# Patient Record
Sex: Male | Born: 1990 | Race: Black or African American | Hispanic: No | Marital: Single | Smoking: Never smoker
Health system: Southern US, Community
[De-identification: ages and names within clinical notes are randomized; demographics above are authoritative.]

---

## 2009-08-19 ENCOUNTER — Emergency Department (HOSPITAL_COMMUNITY): Admission: EM | Admit: 2009-08-19 | Discharge: 2009-08-20 | Payer: Self-pay | Admitting: Emergency Medicine

## 2012-05-11 ENCOUNTER — Emergency Department (HOSPITAL_COMMUNITY): Payer: Managed Care, Other (non HMO)

## 2012-05-11 ENCOUNTER — Encounter (HOSPITAL_COMMUNITY): Payer: Self-pay | Admitting: Emergency Medicine

## 2012-05-11 ENCOUNTER — Emergency Department (HOSPITAL_COMMUNITY)
Admission: EM | Admit: 2012-05-11 | Discharge: 2012-05-11 | Disposition: A | Discharge status: Home / Self Care | Payer: Managed Care, Other (non HMO) | Attending: Emergency Medicine | Admitting: Emergency Medicine

## 2012-05-11 DIAGNOSIS — K922 Gastrointestinal hemorrhage, unspecified: Secondary | ICD-10-CM

## 2012-05-11 LAB — CBC WITH DIFFERENTIAL/PLATELET
Eosinophils Relative: 0 % (ref 0–5)
HCT: 40.7 % (ref 39.0–52.0)
Lymphocytes Relative: 16 % (ref 12–46)
Lymphs Abs: 1.9 10*3/uL (ref 0.7–4.0)
MCV: 70.9 fL — ABNORMAL LOW (ref 78.0–100.0)
Monocytes Absolute: 0.6 10*3/uL (ref 0.1–1.0)
Monocytes Relative: 5 % (ref 3–12)
RBC: 5.74 MIL/uL (ref 4.22–5.81)
WBC: 11.6 10*3/uL — ABNORMAL HIGH (ref 4.0–10.5)

## 2012-05-11 LAB — URINALYSIS, ROUTINE W REFLEX MICROSCOPIC
Glucose, UA: NEGATIVE mg/dL
Hgb urine dipstick: NEGATIVE
Ketones, ur: 15 mg/dL — AB
Leukocytes, UA: NEGATIVE
pH: 8 (ref 5.0–8.0)

## 2012-05-11 LAB — GASTRIC OCCULT BLOOD (1-CARD TO LAB)

## 2012-05-11 LAB — COMPREHENSIVE METABOLIC PANEL
ALT: 36 U/L (ref 0–53)
AST: 30 U/L (ref 0–37)
Albumin: 4.6 g/dL (ref 3.5–5.2)
Alkaline Phosphatase: 97 U/L (ref 39–117)
CO2: 28 mEq/L (ref 19–32)
Creatinine, Ser: 1.13 mg/dL (ref 0.50–1.35)
Glucose, Bld: 112 mg/dL — ABNORMAL HIGH (ref 70–99)
Total Bilirubin: 0.2 mg/dL — ABNORMAL LOW (ref 0.3–1.2)
Total Protein: 7.7 g/dL (ref 6.0–8.3)

## 2012-05-11 LAB — LIPASE, BLOOD: Lipase: 24 U/L (ref 11–59)

## 2012-05-11 LAB — APTT: aPTT: 28 seconds (ref 24–37)

## 2012-05-11 LAB — PROTIME-INR: INR: 1.12 (ref 0.00–1.49)

## 2012-05-11 MED ORDER — LANSOPRAZOLE 30 MG PO CPDR: 30.0000 mg | DELAYED_RELEASE_CAPSULE | Freq: Every day | ORAL | Status: DC

## 2012-05-11 MED ORDER — LANSOPRAZOLE 30 MG PO CPDR: 30.0000 mg | DELAYED_RELEASE_CAPSULE | Freq: Every day | ORAL | Status: AC

## 2012-05-11 MED ORDER — PANTOPRAZOLE SODIUM 40 MG IV SOLR
40.0000 mg | Freq: Once | INTRAVENOUS | Status: AC
  Administered 2012-05-11: 40 mg via INTRAVENOUS
  Filled 2012-05-11: qty 40

## 2012-05-11 MED ORDER — ONDANSETRON HCL 4 MG PO TABS: 4.0000 mg | ORAL_TABLET | Freq: Four times a day (QID) | ORAL | Status: DC

## 2012-05-11 MED ORDER — SODIUM CHLORIDE 0.9 % IV BOLUS (SEPSIS)
1000.0000 mL | Freq: Once | INTRAVENOUS | Status: AC
  Administered 2012-05-11: 1000 mL via INTRAVENOUS

## 2012-05-11 MED ORDER — ONDANSETRON HCL 4 MG PO TABS: 4.0000 mg | ORAL_TABLET | Freq: Four times a day (QID) | ORAL | Status: AC
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
## 2012-05-11 NOTE — ED Provider Notes (Signed)
History     CSN: [id number]  Arrival date & time 05/11/12  0039   First MD Initiated Contact with Patient 05/11/12 [phone]      Chief Complaint  Patient presents with  . Hematemesis  . Diarrhea    (Consider location/radiation/quality/duration/timing/severity/associated sxs/prior treatment) HPI Pt had several episodes of Vomiting today and 1 of diarrhea after eating [employer] this afternoon. States that at the end of several of the episodes of vomiting he noticed some bright blood streaked in the vomit. No blood in diarrhea. No fever, chills, abdominal pain. Pt states he is back to his normal states of health.                              History reviewed. No pertinent past medical history.  History reviewed. No pertinent past surgical history.  History reviewed. No pertinent family history.  History  Substance Use Topics  . Smoking status: Never Smoker   . Smokeless tobacco: Not on file  . Alcohol Use: No      Review of Systems  Constitutional: Negative for fever, chills and fatigue.  Respiratory: Negative for shortness of breath.   Cardiovascular: Negative for chest pain, palpitations and leg swelling.  Gastrointestinal: Positive for nausea, vomiting and diarrhea. Negative for abdominal pain, constipation, blood in stool and rectal pain.  Musculoskeletal: Negative for back pain.  Skin: Negative for rash and wound.  Neurological: Positive for dizziness. Negative for weakness, light-headedness, numbness and headaches.    Allergies  Review of patient's allergies indicates no known allergies.  Home Medications   Current Outpatient Rx  Name Route Sig Dispense Refill  . LANSOPRAZOLE 30 MG PO CPDR Oral Take 1 capsule (30 mg total) by mouth daily. 30 capsule 0  . ONDANSETRON HCL 4 MG PO TABS Oral Take 1 tablet (4 mg total) by mouth every 6 (six) hours. 12 tablet 0    BP 129/75  Pulse 75  Temp 96.9 F (36.1 C) (Oral)  Resp 18  SpO2 100%  Physical Exam  Nursing note  and vitals reviewed. Constitutional: He is oriented to person, place, and time. He appears well-developed and well-nourished. No distress.  HENT:  Head: Normocephalic and atraumatic.  Mouth/Throat: Oropharynx is clear and moist.  Eyes: EOM are normal. Pupils are equal, round, and reactive to light.  Neck: Normal range of motion. Neck supple.  Cardiovascular: Normal rate and regular rhythm.   Pulmonary/Chest: Effort normal and breath sounds normal. No respiratory distress. He has no wheezes. He has no rales.  Abdominal: Soft. Bowel sounds are normal. He exhibits no distension. There is no tenderness. There is no rebound and no guarding.  Musculoskeletal: Normal range of motion. He exhibits no edema and no tenderness.  Neurological: He is alert and oriented to person, place, and time.  Skin: Skin is warm and dry. No rash noted. No erythema.  Psychiatric: He has a normal mood and affect. His behavior is normal.    ED Course  Procedures (including critical care time)  Labs Reviewed  CBC WITH DIFFERENTIAL - Abnormal; Notable for the following:    WBC 11.6 (*)     MCV 70.9 (*)     MCH 23.5 (*)     Neutrophils Relative 79 (*)     Neutro Abs 9.1 (*)     All other components within normal limits  COMPREHENSIVE METABOLIC PANEL - Abnormal; Notable for the following:    Glucose, Bld 112 (*)  Total Bilirubin 0.2 (*)     All other components within normal limits  URINALYSIS, ROUTINE W REFLEX MICROSCOPIC - Abnormal; Notable for the following:    APPearance HAZY (*)     Ketones, ur 15 (*)     All other components within normal limits  POCT GASTRIC OCCULT BLOOD - Abnormal; Notable for the following:    Occult Blood, Gastric POSITIVE (*)     All other components within normal limits  LIPASE, BLOOD  PROTIME-INR  APTT  PH, GASTRIC FLUID (GASTROCCULT CARD)   Dg Abd Acute W/chest  05/11/2012  *RADIOLOGY REPORT*  Clinical Data: 21 year old male with abdominal pain, vomiting and fever.  ACUTE  ABDOMEN SERIES (ABDOMEN 2 VIEW & CHEST 1 VIEW)  Comparison: None  Findings: The cardiomediastinal silhouette is unremarkable. The lungs are clear. There is no evidence of airspace disease, pleural effusion or pneumothorax.  The bowel gas pattern is unremarkable. No dilated bowel loops or pneumoperitoneum noted. No suspicious calcifications are identified. The bony structures are within normal limits.  IMPRESSION: No evidence of acute abnormality - unremarkable bowel gas pattern.   Original Report Authenticated By: Rosendo Gros, M.D.      1. Upper GI bleeding      Dark, watery voit at bedside. Will check gastroccult and labs.  MDM   Discussed with Dr Dulce Sellar. Advised f/u with clinic for upper GI bleed likely mallory weiss tear. Pt remains asymptomatic, no pain or further vomiting. VS stable.        Loren Racer, MD 05/11/12 [phone]

## 2012-05-11 NOTE — ED Notes (Signed)
NAD upon discharge home. Pt denies pain. Prescriptions reviewed.

## 2013-11-06 IMAGING — CR DG ABDOMEN ACUTE W/ 1V CHEST
4 series · 4 of 4 positions shown · non-contrast
Comparison: None

CLINICAL DATA: 21-year-old male with abdominal pain, vomiting and
fever.

ACUTE ABDOMEN SERIES (ABDOMEN 2 VIEW & CHEST 1 VIEW)

[w chest pa]
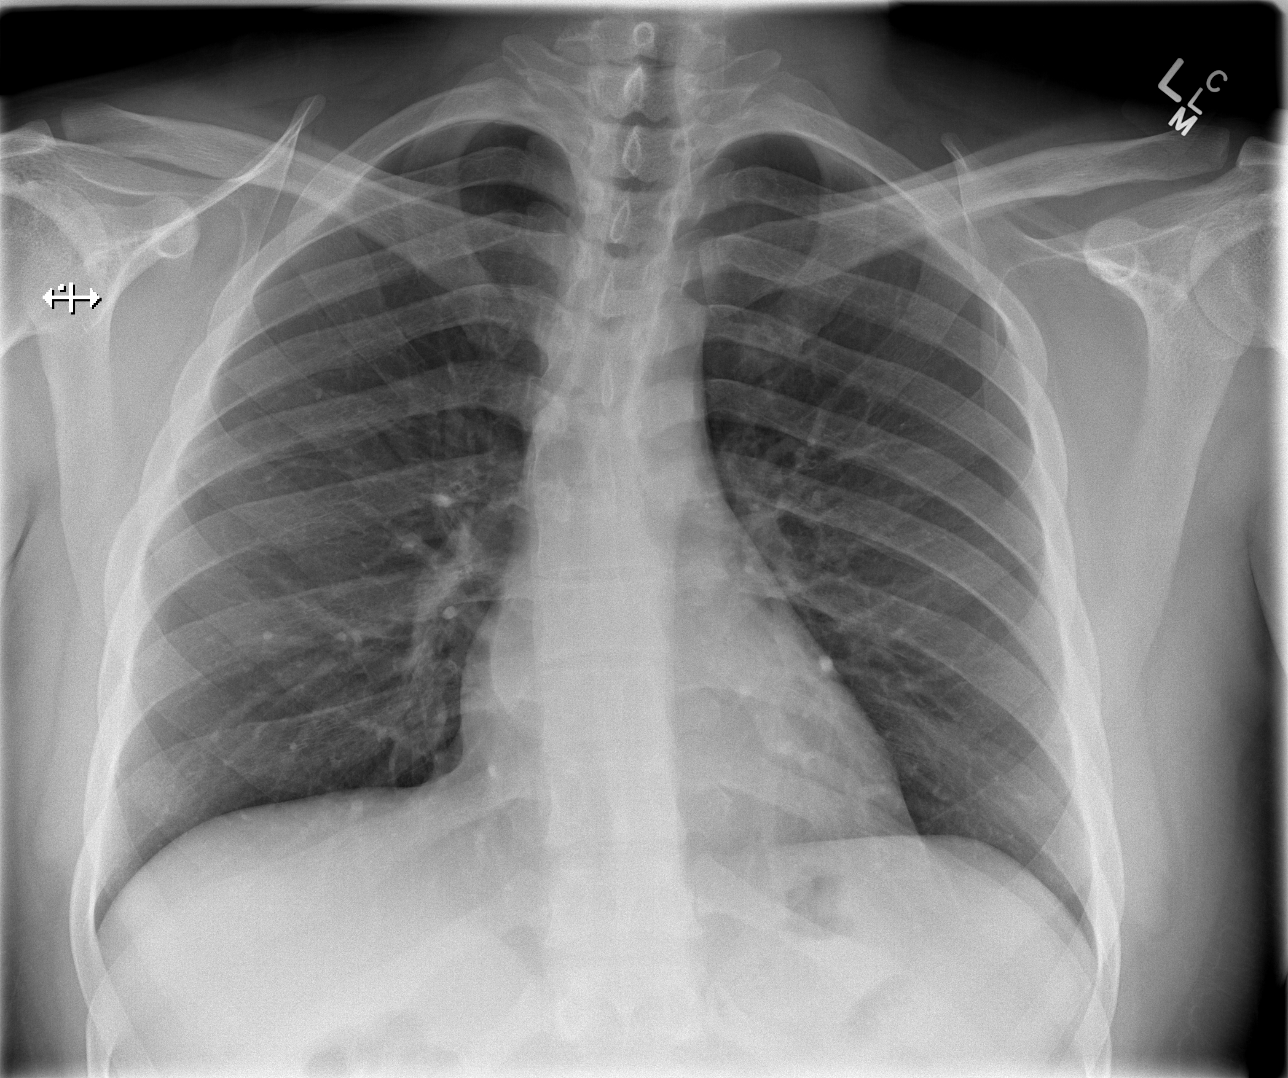

[w abdomen upright]
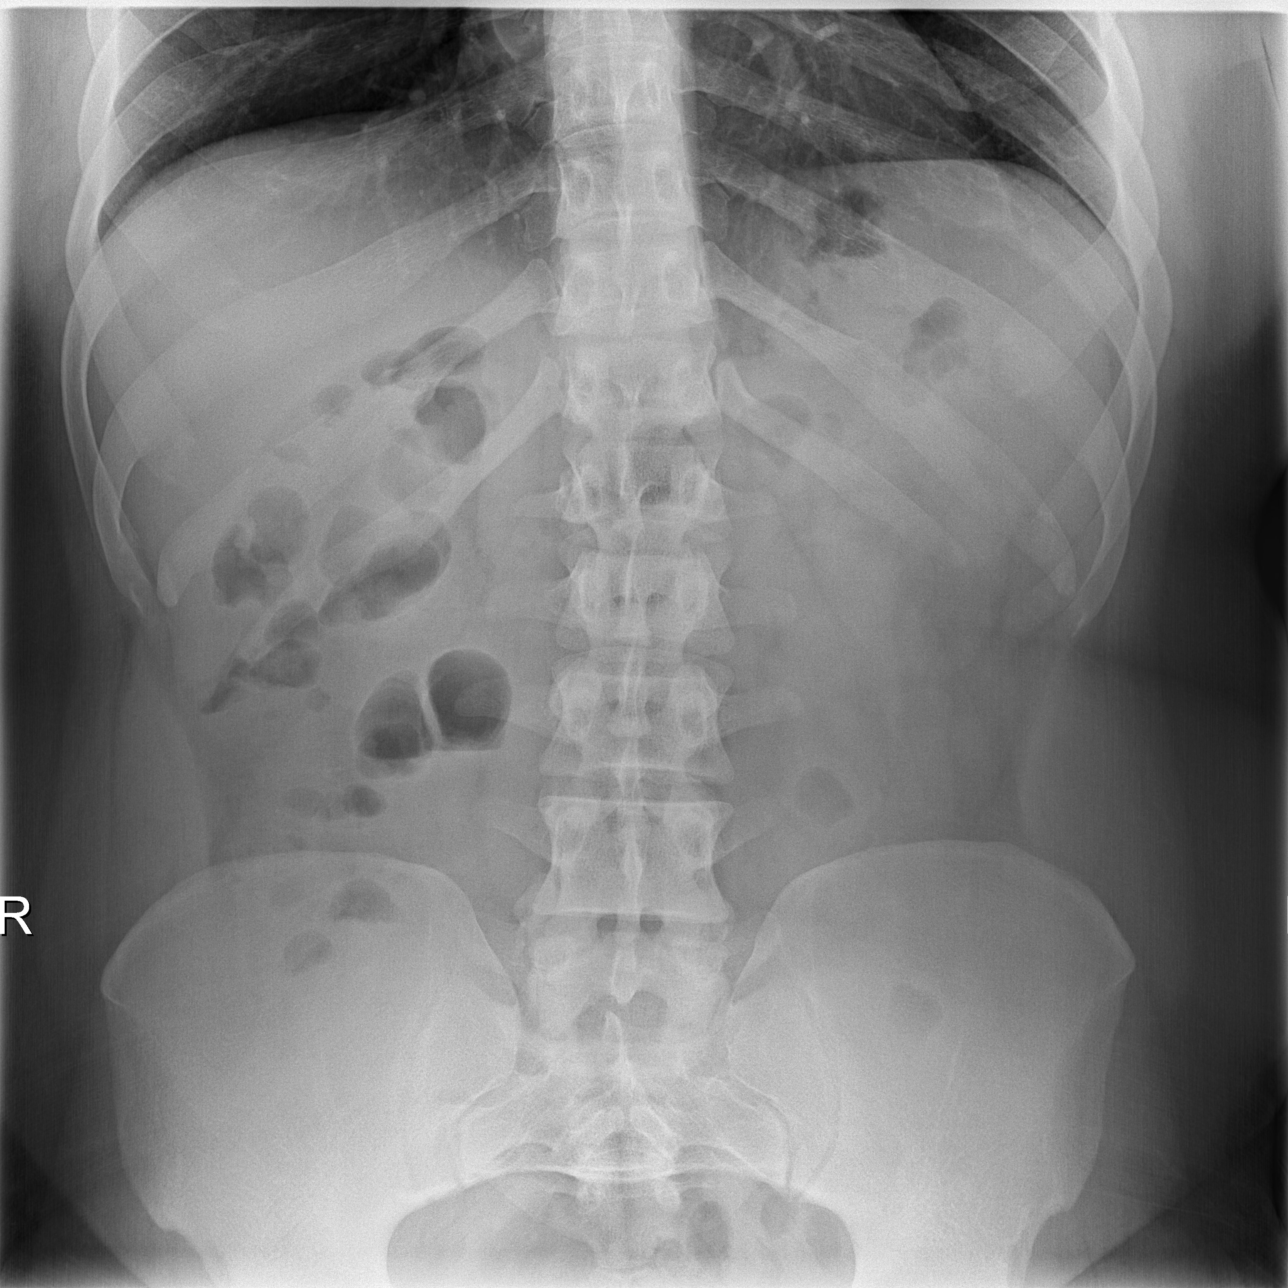

[t abdomen supine (1 of 2)]
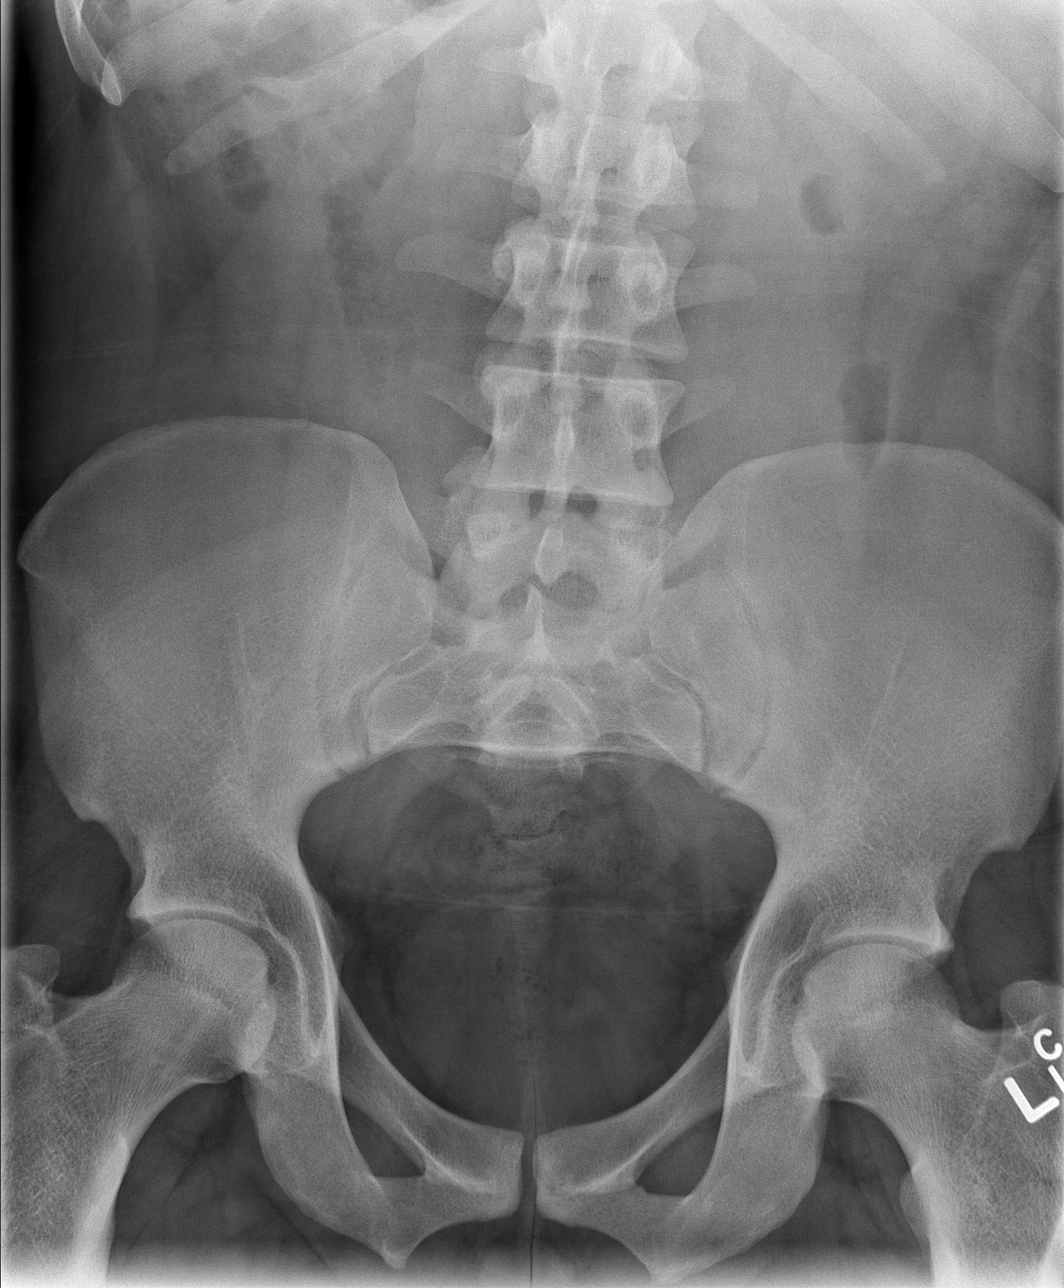

[t abdomen supine (2 of 2)]
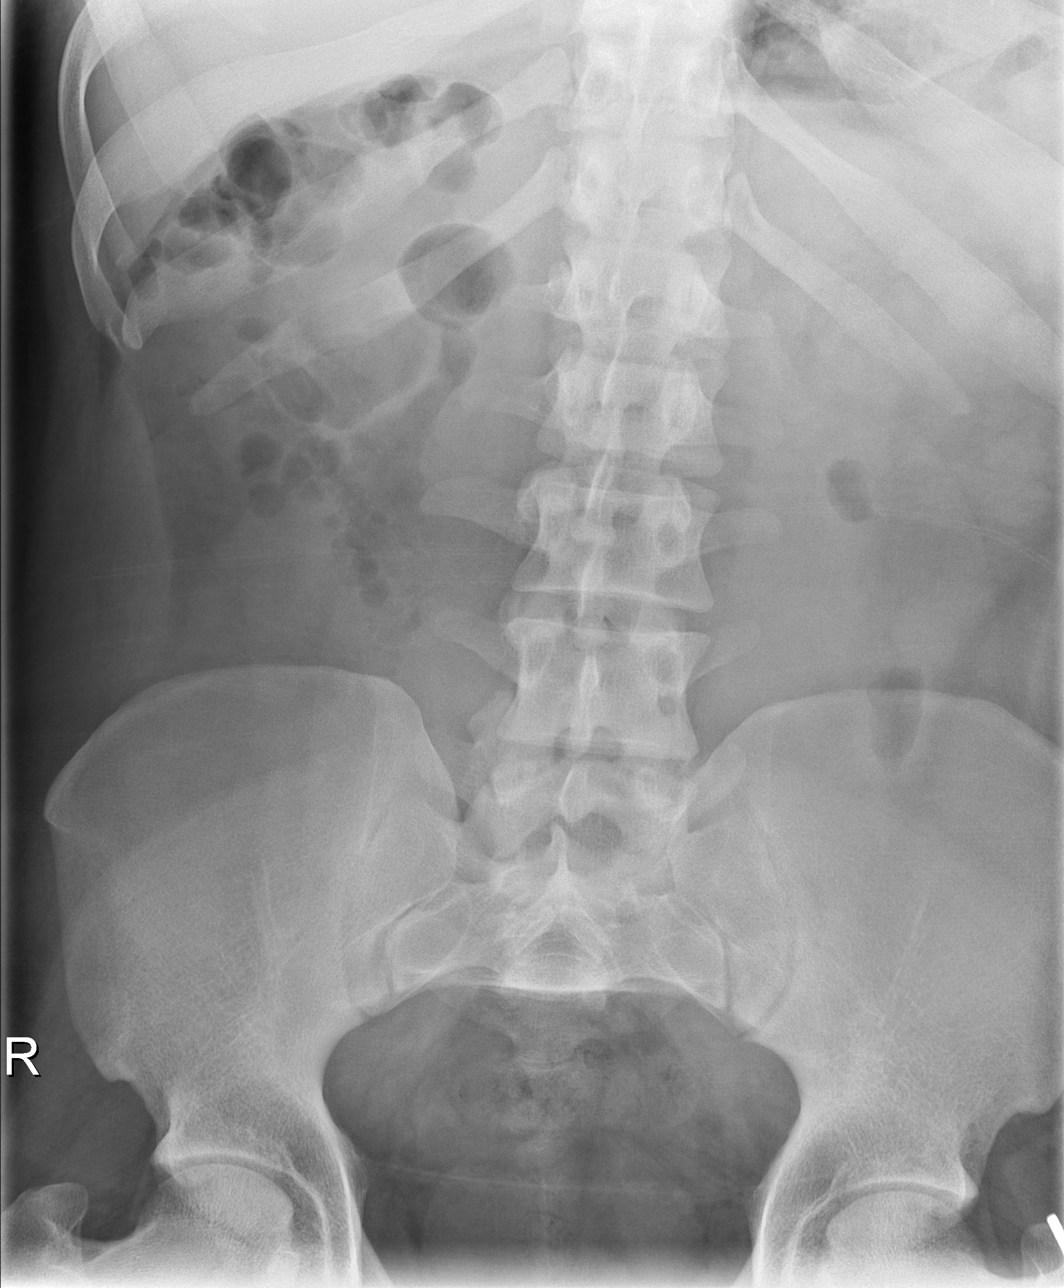

[4 of 4 positions shown; findings below may reference images not displayed]

FINDINGS: The cardiomediastinal silhouette is unremarkable.
The lungs are clear.
There is no evidence of airspace disease, pleural effusion or
pneumothorax.

The bowel gas pattern is unremarkable.
No dilated bowel loops or pneumoperitoneum noted.
No suspicious calcifications are identified.
The bony structures are within normal limits.
IMPRESSION: No evidence of acute abnormality - unremarkable bowel gas pattern.

## 2020-01-27 ENCOUNTER — Emergency Department (HOSPITAL_BASED_OUTPATIENT_CLINIC_OR_DEPARTMENT_OTHER): Payer: BC Managed Care – PPO

## 2020-01-27 ENCOUNTER — Encounter (HOSPITAL_BASED_OUTPATIENT_CLINIC_OR_DEPARTMENT_OTHER): Payer: Self-pay | Admitting: Emergency Medicine

## 2020-01-27 ENCOUNTER — Other Ambulatory Visit: Payer: Self-pay

## 2020-01-27 ENCOUNTER — Emergency Department (HOSPITAL_BASED_OUTPATIENT_CLINIC_OR_DEPARTMENT_OTHER)
Admission: EM | Admit: 2020-01-27 | Discharge: 2020-01-27 | Disposition: A | Discharge status: Home / Self Care | Payer: BC Managed Care – PPO | Attending: Emergency Medicine | Admitting: Emergency Medicine

## 2020-01-27 DIAGNOSIS — M79642 Pain in left hand: Secondary | ICD-10-CM | POA: Insufficient documentation

## 2020-01-27 DIAGNOSIS — Z79899 Other long term (current) drug therapy: Secondary | ICD-10-CM | POA: Diagnosis not present

## 2020-01-27 MED ORDER — METHOCARBAMOL 500 MG PO TABS: 500.0000 mg | ORAL_TABLET | Freq: Two times a day (BID) | ORAL | 0 refills | Status: AC
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
## 2020-02-01 ENCOUNTER — Emergency Department (HOSPITAL_BASED_OUTPATIENT_CLINIC_OR_DEPARTMENT_OTHER)
Admission: EM | Admit: 2020-02-01 | Discharge: 2020-02-01 | Disposition: A | Discharge status: Home / Self Care | Payer: BC Managed Care – PPO | Attending: Emergency Medicine | Admitting: Emergency Medicine

## 2020-02-01 ENCOUNTER — Encounter (HOSPITAL_BASED_OUTPATIENT_CLINIC_OR_DEPARTMENT_OTHER): Payer: Self-pay

## 2020-02-01 ENCOUNTER — Other Ambulatory Visit: Payer: Self-pay

## 2020-02-01 DIAGNOSIS — Y92411 Interstate highway as the place of occurrence of the external cause: Secondary | ICD-10-CM | POA: Diagnosis not present

## 2020-02-01 DIAGNOSIS — S0990XA Unspecified injury of head, initial encounter: Secondary | ICD-10-CM | POA: Diagnosis present

## 2020-02-01 DIAGNOSIS — S0003XA Contusion of scalp, initial encounter: Secondary | ICD-10-CM

## 2020-02-01 DIAGNOSIS — Y998 Other external cause status: Secondary | ICD-10-CM | POA: Diagnosis not present

## 2020-02-01 DIAGNOSIS — Y9389 Activity, other specified: Secondary | ICD-10-CM | POA: Insufficient documentation
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
## 2020-02-01 NOTE — Discharge Instructions (Addendum)
Take Tylenol 1000 mg rotated with ibuprofen 600 mg every 4 hours as needed for pain.  Return to the emergency department if you develop severe headache, weakness/numbness of your face or extremities, visual disturbances, or other new and concerning symptoms.
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
